# Patient Record
Sex: Male | Born: 1995
Health system: Southern US, Community
[De-identification: ages and names within clinical notes are randomized; demographics above are authoritative.]

## PROBLEM LIST (undated history)

## (undated) DIAGNOSIS — J309 Allergic rhinitis, unspecified: Secondary | ICD-10-CM

## (undated) DIAGNOSIS — K219 Gastro-esophageal reflux disease without esophagitis: Secondary | ICD-10-CM

## (undated) HISTORY — DX: Gastro-esophageal reflux disease without esophagitis: K21.9

## (undated) HISTORY — DX: Allergic rhinitis, unspecified: J30.9

---
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
## 2015-09-12 ENCOUNTER — Encounter: Payer: Self-pay | Admitting: Allergy and Immunology

## 2015-09-12 ENCOUNTER — Ambulatory Visit (INDEPENDENT_AMBULATORY_CARE_PROVIDER_SITE_OTHER): Payer: BLUE CROSS/BLUE SHIELD | Admitting: Allergy and Immunology

## 2015-09-12 VITALS — BP 116/80 | HR 80 | Resp 16 | Ht 66.22 in | Wt 147.3 lb

## 2015-09-12 DIAGNOSIS — J387 Other diseases of larynx: Secondary | ICD-10-CM | POA: Diagnosis not present

## 2015-09-12 DIAGNOSIS — J309 Allergic rhinitis, unspecified: Secondary | ICD-10-CM | POA: Diagnosis not present

## 2015-09-12 DIAGNOSIS — H1010 Acute atopic conjunctivitis, unspecified eye: Secondary | ICD-10-CM

## 2015-09-12 DIAGNOSIS — K219 Gastro-esophageal reflux disease without esophagitis: Secondary | ICD-10-CM

## 2015-09-12 MED ORDER — DEXLANSOPRAZOLE 60 MG PO CPDR: 60.0000 mg | DELAYED_RELEASE_CAPSULE | Freq: Every day | ORAL | Status: DC

## 2015-09-12 NOTE — Patient Instructions (Addendum)
  1. Treat inflammation:   A. continue montelukast 10 mg daily  B. start OTC Rhinocort one spray each nostril daily.  C. prednisone 10 mg one tablet once a day for 10 days only  2. Treat reflux:   A. minimize any chocolate or caffeine consumption  B. Dexilant 60mg  one tablet one time per day  3. If needed:   A. nasal saline washes  B. antihistamines  4. Immunotherapy?  5. Return to clinic in 4 weeks or earlier if problem

## 2015-09-12 NOTE — Progress Notes (Signed)
Follow-up Note  Referring Provider: No ref. provider found Primary Provider: No PCP Per Patient Date of Office Visit: 09/12/2015  Subjective:   Kenneth Tucker (DOB: 11-11-1995) is a 20 y.o. male [facility] in re-evaluation of the following:  HPI: Kenneth Tucker presents to this clinic in reevaluation of his allergic rhinoconjunctivitis and LPR. I've not seen him in his clinic since June 2016. He was attending school at [place] for the past 8 months.  He still complains about having mucus stuck in his throat and some throat clearing. He was participating in a musical this semester and his voice became very raspy and he's lost range. He's had some decreased ability to smell to some degree without any significant nasal congestion. Previously he did have extensive evaluation with a gastroenterologist, ear nose and throat physician, and a limited sinus CT scan which identified very mild chronic sinusitis back in 2014. His evaluation with a gastroenterologist did lead to the diagnosis of gastritis identified on a upper endoscopy. His evaluation with an ear nose and throat physician did not identify any significant abnormality regarding his laryngeal structures.    Medication List           EPIPEN 2-PAK IJ  Inject as directed. USE AS DIRECTED FOR LIFE-THREATENING ALLERGIC REACTIONS.     fexofenadine 180 MG tablet  Commonly known as:  ALLEGRA  Take 180 mg by mouth daily.     GUAIFENESIN PO  Take by mouth as needed.     montelukast 10 MG tablet  Commonly known as:  SINGULAIR  Take 10 mg by mouth daily.        History reviewed. No pertinent past medical history.  History reviewed. No pertinent past surgical history.  No Known Allergies  Review of systems negative except as noted in HPI / PMHx or noted below:  Review of Systems  Constitutional: Negative.   HENT: Negative.   Eyes: Negative.   Respiratory: Negative.     Cardiovascular: Negative.   Gastrointestinal: Negative.   Genitourinary: Negative.   Musculoskeletal: Negative.   Skin: Negative.   Neurological: Negative.   Endo/Heme/Allergies: Negative.   Psychiatric/Behavioral: Negative.      Objective:   Filed Vitals:   09/12/15 1143  BP: 116/80  Pulse: 80  Resp: 16   Height: 5' 6.22" (168.2 cm)  Weight: 147 lb 4.3 oz (66.8 kg)   Physical Exam  Constitutional: He is well-developed, well-nourished, and in no distress.  HENT:  Head: Normocephalic.  Right Ear: Tympanic membrane, external ear and ear canal normal.  Left Ear: Tympanic membrane, external ear and ear canal normal.  Nose: Nose normal. No mucosal edema or rhinorrhea.  Mouth/Throat: Uvula is midline, oropharynx is clear and moist and mucous membranes are normal. No oropharyngeal exudate.  Eyes: Conjunctivae are normal.  Neck: Trachea normal. No tracheal tenderness present. No tracheal deviation present. No thyromegaly present.  Cardiovascular: Normal rate, regular rhythm, S1 normal, S2 normal and normal heart sounds.   No murmur heard. Pulmonary/Chest: Breath sounds normal. No stridor. No respiratory distress. He has no wheezes. He has no rales.  Musculoskeletal: He exhibits no edema.  Lymphadenopathy:       Head (right side): No tonsillar adenopathy present.       Head (left side): No tonsillar adenopathy present.    He has no cervical adenopathy.  Neurological: He is alert. Gait normal.  Skin: No rash noted. He is not diaphoretic. No  erythema. Nails show no clubbing.  Psychiatric: Mood and affect normal.    Diagnostics: None    Assessment and Plan:   1. Allergic rhinoconjunctivitis   2. LPRD (laryngopharyngeal reflux disease)     1. Treat inflammation:   A. continue montelukast 10 mg daily  B. start OTC Rhinocort one spray each nostril daily.  C. prednisone 10 mg one tablet once a day for 10 days only  2. Treat reflux:   A. minimize any chocolate or  caffeine consumption  B. Dexilant 60mg  one tablet one time per day  3. If needed:   A. nasal saline washes  B. antihistamines  4. Immunotherapy?  5. Return to clinic in 4 weeks or earlier if problem  I will treat Kenneth Tucker with anti-inflammatory medications for his respiratory tract and therapy directed against reflux assuming that he does have a component of inflammation affecting both his upper airway and mid airway with a reflux trigger and an atopic trigger. He is in this area for the next 3 months and thus I will see him back in this clinic in 4 weeks to make a determination about how to proceed pending his response. He will be returning to school in [place] come this September.  Laurette SchimkeEric Kozlow, MD [facility]

## 2015-10-06 ENCOUNTER — Encounter: Payer: Self-pay | Admitting: Allergy and Immunology

## 2015-10-06 ENCOUNTER — Ambulatory Visit: Payer: BLUE CROSS/BLUE SHIELD | Admitting: Allergy and Immunology

## 2015-10-06 ENCOUNTER — Ambulatory Visit (INDEPENDENT_AMBULATORY_CARE_PROVIDER_SITE_OTHER): Payer: BLUE CROSS/BLUE SHIELD | Admitting: Allergy and Immunology

## 2015-10-06 VITALS — BP 114/70 | HR 64 | Resp 20

## 2015-10-06 DIAGNOSIS — K219 Gastro-esophageal reflux disease without esophagitis: Secondary | ICD-10-CM

## 2015-10-06 DIAGNOSIS — J387 Other diseases of larynx: Secondary | ICD-10-CM

## 2015-10-06 DIAGNOSIS — J309 Allergic rhinitis, unspecified: Secondary | ICD-10-CM | POA: Diagnosis not present

## 2015-10-06 DIAGNOSIS — H1010 Acute atopic conjunctivitis, unspecified eye: Secondary | ICD-10-CM

## 2015-10-06 NOTE — Progress Notes (Signed)
Follow-up Note  Referring Provider: No ref. provider found Primary Provider: No PCP Per Patient Date of Office Visit: 10/06/2015  Subjective:   Kenneth Tucker (DOB: 02-Apr-1996) is a 20 y.o. male who [facility] 10[facility]/2015 in re-evaluation of the following:  HPI: Kenneth Tucker returns to this clinic in reevaluation of his allergic rhinoconjunctivitis and LPR. Using prednisone he found that he is able to smell better and is not as congested. He thinks his throat is about the same. It might of been a little bit better while using prednisone but has reverted back to his previous condition. He continues to use montelukast consistently and is using Dexilant every day. He did not start his Dexilant until one week ago and for some reason he is not using Rhinocort.    Medication List           acetaminophen 325 MG tablet  Commonly known as:  TYLENOL  Take 650 mg by mouth every 6 (six) hours as needed.     dexlansoprazole 60 MG capsule  Commonly known as:  DEXILANT  Take 1 capsule (60 mg total) by mouth daily.     EPIPEN 2-PAK IJ  Inject as directed. Reported on 10/06/2015     fexofenadine 180 MG tablet  Commonly known as:  ALLEGRA  Take 180 mg by mouth daily.     guaifenesin 400 MG Tabs tablet  Commonly known as:  HUMIBID E  Take 400 mg by mouth daily.     montelukast 10 MG tablet  Commonly known as:  SINGULAIR  Take 10 mg by mouth daily.     MULTIVITAMIN ADULT PO  Take 1 tablet by mouth daily.        History reviewed. No pertinent past medical history.  History reviewed. No pertinent past surgical history.  No Known Allergies  Review of systems negative except as noted in HPI / PMHx or noted below:  Review of Systems  Constitutional: Negative.   HENT: Negative.   Eyes: Negative.   Respiratory: Negative.   Cardiovascular: Negative.   Gastrointestinal: Negative.   Genitourinary: Negative.   Musculoskeletal: Negative.   Skin:  Negative.   Neurological: Negative.   Endo/Heme/Allergies: Negative.   Psychiatric/Behavioral: Negative.      Objective:   Filed Vitals:   10/06/15 1533  BP: 114/70  Pulse: 64  Resp: 20          Physical Exam  Constitutional: He is well-developed, well-nourished, and in no distress.  HENT:  Head: Normocephalic.  Right Ear: Tympanic membrane, external ear and ear canal normal.  Left Ear: Tympanic membrane, external ear and ear canal normal.  Nose: Nose normal. No mucosal edema or rhinorrhea.  Mouth/Throat: Uvula is midline, oropharynx is clear and moist and mucous membranes are normal. No oropharyngeal exudate.  Eyes: Conjunctivae are normal.  Neck: Trachea normal. No tracheal tenderness present. No tracheal deviation present. No thyromegaly present.  Cardiovascular: Normal rate, regular rhythm, S1 normal, S2 normal and normal heart sounds.   No murmur heard. Pulmonary/Chest: Breath sounds normal. No stridor. No respiratory distress. He has no wheezes. He has no rales.  Musculoskeletal: He exhibits no edema.  Lymphadenopathy:       Head (right side): No tonsillar adenopathy present.       Head (left side): No tonsillar adenopathy present.    He has no cervical adenopathy.  Neurological: He is alert. Gait normal.  Skin: No rash noted. He is not diaphoretic.  No erythema. Nails show no clubbing.  Psychiatric: Mood and affect normal.    Diagnostics: None    Assessment and Plan:   1. Allergic rhinoconjunctivitis   2. LPRD (laryngopharyngeal reflux disease)     1. Treat inflammation:   A. continue montelukast 10 mg daily  B. continue OTC Rhinocort one spray each nostril daily.  2. Continue to Treat reflux:   A. minimize any chocolate or caffeine consumption  B. Dexilant  one tablet one time per day  3. If needed:   A. nasal saline washes  B. antihistamines  4. Immunotherapy?  5. Return to clinic in August or earlier if problem  Kenneth Tucker has a  combination of atopic respiratory disease and reflux-induced respiratory disease and hopefully he is going to respond to consistent use of anti-inflammatory medications and therapy against reflux as noted above. I will see him back in this clinic at the end of August to make a determination about how to proceed pending his response. He will be returning to [place] this fall to go back to school and apparently there is not a infirmary at his school that will allow him to receive immunotherapy.  Laurette Schimke, MD [facility]

## 2015-10-06 NOTE — Patient Instructions (Signed)
  1. Treat inflammation:   A. continue montelukast 10 mg daily  B. continue OTC Rhinocort one spray each nostril daily.  2. Continue to Treat reflux:   A. minimize any chocolate or caffeine consumption  B. Dexilant 60mg  one tablet one time per day  3. If needed:   A. nasal saline washes  B. antihistamines  4. Immunotherapy?  5. Return to clinic in August or earlier if problem

## 2015-10-10 ENCOUNTER — Encounter: Payer: Self-pay | Admitting: *Deleted

## 2015-11-16 ENCOUNTER — Other Ambulatory Visit: Payer: Self-pay | Admitting: *Deleted

## 2015-11-16 MED ORDER — DEXLANSOPRAZOLE 60 MG PO CPDR: 60.0000 mg | DELAYED_RELEASE_CAPSULE | Freq: Every day | ORAL | 1 refills | Status: DC

## 2015-11-16 MED ORDER — MONTELUKAST SODIUM 10 MG PO TABS: 10.0000 mg | ORAL_TABLET | Freq: Every day | ORAL | 1 refills | Status: DC

## 2015-11-30 ENCOUNTER — Encounter: Payer: Self-pay | Admitting: Allergy and Immunology

## 2015-11-30 ENCOUNTER — Ambulatory Visit (INDEPENDENT_AMBULATORY_CARE_PROVIDER_SITE_OTHER): Payer: BLUE CROSS/BLUE SHIELD | Admitting: Allergy and Immunology

## 2015-11-30 VITALS — BP 108/72 | HR 64 | Resp 18

## 2015-11-30 DIAGNOSIS — K219 Gastro-esophageal reflux disease without esophagitis: Secondary | ICD-10-CM

## 2015-11-30 DIAGNOSIS — H1010 Acute atopic conjunctivitis, unspecified eye: Secondary | ICD-10-CM

## 2015-11-30 DIAGNOSIS — J309 Allergic rhinitis, unspecified: Secondary | ICD-10-CM

## 2015-11-30 DIAGNOSIS — J387 Other diseases of larynx: Secondary | ICD-10-CM | POA: Diagnosis not present

## 2015-11-30 DIAGNOSIS — J328 Other chronic sinusitis: Secondary | ICD-10-CM | POA: Diagnosis not present

## 2015-11-30 MED ORDER — AMOXICILLIN-POT CLAVULANATE 875-125 MG PO TABS: ORAL_TABLET | ORAL | 0 refills | Status: DC

## 2015-11-30 NOTE — Progress Notes (Signed)
Follow-up Note  Referring Provider: No ref. provider found Primary Provider: No PCP Per Patient Date of Office Visit: 11/30/2015  Subjective:   Kenneth Tucker (DOB: [birth year]) is a 20 y.o. male who [facility] 11[facility]30/2015 in re-evaluation of the following:  HPI: Abas returns to this clinic in reevaluation of his allergic rhinoconjunctivitis, chronic sinusitis, and LPR. With his last visit of 10/06/2015 we asked him to use anti-inflammatory medications for his respiratory tract on a consistent basis and use therapy directed against LPR on a consistent basis. As a result of that treatment he is really not that much better. He still has decreased ability to smell. His throat might be slightly better with less throat clearing but he still has drainage. His airway is not ncongested but he goes through a box of Kleenex every week. He is only using his Rhinocort twice a week.    Medication List      acetaminophen 325 MG tablet Commonly known as:  TYLENOL Take 650 mg by mouth every 6 (six) hours as needed.   dexlansoprazole 60 MG capsule Commonly known as:  DEXILANT Take 1 capsule (60 mg total) by mouth daily.   EPIPEN 2-PAK IJ Inject as directed. Reported on 10/06/2015   fexofenadine 180 MG tablet Commonly known as:  ALLEGRA Take 180 mg by mouth daily.   guaifenesin 400 MG Tabs tablet Commonly known as:  HUMIBID E Take 400 mg by mouth daily.   montelukast 10 MG tablet Commonly known as:  SINGULAIR Take 1 tablet (10 mg total) by mouth daily.   MULTIVITAMIN ADULT PO Take 1 tablet by mouth daily.       Past Medical History:  Diagnosis Date  . Allergic rhinitis   . LPRD (laryngopharyngeal reflux disease)     History reviewed. No pertinent surgical history.  No Known Allergies  Review of systems negative except as noted in HPI / PMHx or noted below:  Review of Systems  Constitutional: Negative.   HENT: Negative.   Eyes:  Negative.   Respiratory: Negative.   Cardiovascular: Negative.   Gastrointestinal: Negative.   Genitourinary: Negative.   Musculoskeletal: Negative.   Skin: Negative.   Neurological: Negative.   Endo/Heme/Allergies: Negative.   Psychiatric/Behavioral: Negative.      Objective:   Vitals:   11/30/15 1343  BP: 108/72  Pulse: 64  Resp: 18          Physical Exam  Constitutional: He is well-developed, well-nourished, and in no distress.  HENT:  Head: Normocephalic.  Right Ear: Tympanic membrane, external ear and ear canal normal.  Left Ear: Tympanic membrane, external ear and ear canal normal.  Nose: Nose normal. No mucosal edema or rhinorrhea.  Mouth/Throat: Uvula is midline, oropharynx is clear and moist and mucous membranes are normal. No oropharyngeal exudate.  Eyes: Conjunctivae are normal.  Neck: Trachea normal. No tracheal tenderness present. No tracheal deviation present. No thyromegaly present.  Cardiovascular: Normal rate, regular rhythm, S1 normal, S2 normal and normal heart sounds.   No murmur heard. Pulmonary/Chest: Breath sounds normal. No stridor. No respiratory distress. He has no wheezes. He has no rales.  Musculoskeletal: He exhibits no edema.  Lymphadenopathy:       Head (right side): No tonsillar adenopathy present.       Head (left side): No tonsillar adenopathy present.    He has no cervical adenopathy.  Neurological: He is alert. Gait normal.  Skin: No rash noted. He is  not diaphoretic. No erythema. Nails show no clubbing.  Psychiatric: Mood and affect normal.    Diagnostics: none  Assessment and Plan:   1. Allergic rhinoconjunctivitis   2. LPRD (laryngopharyngeal reflux disease)   3. Other chronic sinusitis     1. Continue to Treat inflammation:   A. continue montelukast 10 mg daily  B. continue OTC Rhinocort one spray each nostril daily.  2. Continue to Treat reflux:   A. minimize any chocolate or caffeine consumption  B. Discontinue  Dexilant 60mg   3. Treat infection:   A. Augmentin 875 one tablet twice a day for the next 28 days  3. If needed:   A. nasal saline washes  B. antihistamines  4. Immunotherapy?  5. Return to clinic in 6 months or earlier if problem  6. Get fall flu vaccine  I've asked [name] to consistently use a nasal steroid in addition to his leukotriene modifier. As well, we will give him a full 28 days of Augmentin in an attempt to clear out any chronic sinusitis that may still be remaining. I'm going to eliminate his Dexilant and see what happens regarding her throat issue at this point. He should consider a course of immunotherapy and if he can logistically get that arranged when he returns to [place] in 3 weeks he should pursue that form of treatment. I'll see him back in this clinic in 6 months or earlier if there is a problem.  Laurette SchimkeEric Albirta Rhinehart, MD [facility]

## 2015-11-30 NOTE — Patient Instructions (Addendum)
  1. Continue to Treat inflammation:   A. continue montelukast 10 mg daily  B. continue OTC Rhinocort one spray each nostril daily.  2. Continue to Treat reflux:   A. minimize any chocolate or caffeine consumption  B. Discontinue Dexilant   3. Treat infection:   A. Augmentin 875 one tablet twice a day for the next 28 days  3. If needed:   A. nasal saline washes  B. antihistamines  4. Immunotherapy?  5. Return to clinic in 6 months or earlier if problem  6. Get fall flu vaccine

## 2016-03-20 ENCOUNTER — Other Ambulatory Visit: Payer: Self-pay | Admitting: *Deleted

## 2016-03-20 MED ORDER — MONTELUKAST SODIUM 10 MG PO TABS
10.0000 mg | ORAL_TABLET | Freq: Every day | ORAL | 0 refills | Status: DC
Start: 2016-03-20 — End: 2016-10-18

## 2016-04-26 DIAGNOSIS — L700 Acne vulgaris: Secondary | ICD-10-CM | POA: Diagnosis not present

## 2016-09-27 DIAGNOSIS — Z1389 Encounter for screening for other disorder: Secondary | ICD-10-CM | POA: Diagnosis not present

## 2016-09-27 DIAGNOSIS — Z6822 Body mass index (BMI) 22.0-22.9, adult: Secondary | ICD-10-CM | POA: Diagnosis not present

## 2016-09-27 DIAGNOSIS — Z0000 Encounter for general adult medical examination without abnormal findings: Secondary | ICD-10-CM | POA: Diagnosis not present

## 2016-10-15 DIAGNOSIS — F3289 Other specified depressive episodes: Secondary | ICD-10-CM | POA: Diagnosis not present

## 2016-10-17 DIAGNOSIS — L700 Acne vulgaris: Secondary | ICD-10-CM | POA: Diagnosis not present

## 2016-10-18 ENCOUNTER — Other Ambulatory Visit: Payer: Self-pay | Admitting: *Deleted

## 2016-10-18 MED ORDER — MONTELUKAST SODIUM 10 MG PO TABS: 10.0000 mg | ORAL_TABLET | Freq: Every day | ORAL | 0 refills | Status: DC

## 2016-10-25 ENCOUNTER — Ambulatory Visit (INDEPENDENT_AMBULATORY_CARE_PROVIDER_SITE_OTHER): Payer: BLUE CROSS/BLUE SHIELD | Admitting: Pharmacist Clinician (PhC)/ Clinical Pharmacy Specialist

## 2016-10-25 ENCOUNTER — Other Ambulatory Visit (HOSPITAL_COMMUNITY)
Admission: RE | Admit: 2016-10-25 | Discharge: 2016-10-25 | Disposition: A | Discharge status: Home / Self Care | Payer: BLUE CROSS/BLUE SHIELD | Source: Ambulatory Visit | Attending: Infectious Diseases | Admitting: Infectious Diseases

## 2016-10-25 DIAGNOSIS — Z7251 High risk heterosexual behavior: Secondary | ICD-10-CM

## 2016-10-25 LAB — BASIC METABOLIC PANEL
BUN: 9 mg/dL (ref 7–25)
CALCIUM: 9.5 mg/dL (ref 8.6–10.3)
CO2: 26 mmol/L (ref 20–31)
CREATININE: 0.79 mg/dL (ref 0.60–1.35)
Chloride: 103 mmol/L (ref 98–110)
Glucose, Bld: 94 mg/dL (ref 65–99)
Potassium: 4.2 mmol/L (ref 3.5–5.3)
SODIUM: 139 mmol/L (ref 135–146)

## 2016-10-25 NOTE — Patient Instructions (Signed)
Labs today Follow up with me in 1 month

## 2016-10-25 NOTE — Progress Notes (Signed)
HPI: Kenneth Tucker is a 21 y.o. male who is here for his first PrEP visit with pharmacy.   Allergies: No Known Allergies  Vitals:    Past Medical History: Past Medical History:  Diagnosis Date  . Allergic rhinitis   . LPRD (laryngopharyngeal reflux disease)     Social History: Social History   Social History  . Marital status: Single    Spouse name: N/A  . Number of children: N/A  . Years of education: N/A   Social History Main Topics  . Smoking status: Never Smoker  . Smokeless tobacco: Never Used  . Alcohol use No  . Drug use: No  . Sexual activity: Not on file   Other Topics Concern  . Not on file   Social History Narrative  . No narrative on file    Labs: No results found for: HIV1RNAQUANT, HIV1RNAVL, CD4TABS, HEPBSAB, HEPBSAG, HCVAB  CrCl: CrCl cannot be calculated (No order found.).  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 He is home for the summer so he reached out to his primary physician Dr. [name] for PrEP. Dr. [name] referred him here. Therefore, he'll only need PrEP for the summer before he is back in school. He'll reach to a PrEP clinic up there. He is a versatile (including oral) MSM patient with the last sexual encounter about 3 months ago.He consistently use condoms. Explained the whole process for PrEP to him today. He has done some research online about it. He has never been on medication before. Counseled him on the side effects, adherence, and consistent condom use since TRV can only reduce his risk for HIV not STDs. He got an HIV/STDs test about a month ago so we will have to repeat again today.   Recommendations:  All baseline HIV PrEP labs (HIV, bmet, STDs, hep A/B/C) TRV x30d if HIV negative F/u in 30 days   Kenneth SouthwardMinh Tucker, PharmD, BCPS, AAHIVP, CPP Clinical Infectious Disease Pharmacist [facility] 10/25/2016, 2:57 PM

## 2016-10-26 ENCOUNTER — Other Ambulatory Visit: Payer: Self-pay

## 2016-10-26 DIAGNOSIS — F3289 Other specified depressive episodes: Secondary | ICD-10-CM | POA: Diagnosis not present

## 2016-10-26 LAB — CYTOLOGY, (ORAL, ANAL, URETHRAL) ANCILLARY ONLY
Chlamydia: NEGATIVE
Chlamydia: NEGATIVE
Neisseria Gonorrhea: NEGATIVE
Neisseria Gonorrhea: NEGATIVE

## 2016-10-26 LAB — HEPATITIS A ANTIBODY, TOTAL: HEP A TOTAL AB: REACTIVE — AB

## 2016-10-26 LAB — HEPATITIS C ANTIBODY: HCV AB: NEGATIVE

## 2016-10-26 LAB — URINE CYTOLOGY ANCILLARY ONLY
CHLAMYDIA, DNA PROBE: NEGATIVE
NEISSERIA GONORRHEA: NEGATIVE

## 2016-10-26 LAB — HEPATITIS B SURFACE ANTIGEN: HEP B S AG: NEGATIVE

## 2016-10-26 LAB — HEPATITIS B SURFACE ANTIBODY,QUALITATIVE: Hep B S Ab: NEGATIVE

## 2016-10-26 LAB — RPR

## 2016-10-26 LAB — HIV ANTIBODY (ROUTINE TESTING W REFLEX): HIV: NONREACTIVE

## 2016-10-26 MED ORDER — EMTRICITABINE-TENOFOVIR DF 200-300 MG PO TABS: 1.0000 | ORAL_TABLET | Freq: Every day | ORAL | 0 refills | Status: DC

## 2016-10-26 MED FILL — TRUVADA 200-300 MG TABS: 200-300 | 30 days supply | Qty: 30 | Fill #0

## 2016-11-02 DIAGNOSIS — L700 Acne vulgaris: Secondary | ICD-10-CM | POA: Diagnosis not present

## 2016-11-14 DIAGNOSIS — F3289 Other specified depressive episodes: Secondary | ICD-10-CM | POA: Diagnosis not present

## 2016-11-20 DIAGNOSIS — F3289 Other specified depressive episodes: Secondary | ICD-10-CM | POA: Diagnosis not present

## 2016-11-21 ENCOUNTER — Ambulatory Visit (INDEPENDENT_AMBULATORY_CARE_PROVIDER_SITE_OTHER): Payer: BLUE CROSS/BLUE SHIELD | Admitting: Allergy and Immunology

## 2016-11-21 ENCOUNTER — Encounter: Payer: Self-pay | Admitting: Allergy and Immunology

## 2016-11-21 VITALS — BP 118/74 | HR 66 | Resp 14

## 2016-11-21 DIAGNOSIS — J3089 Other allergic rhinitis: Secondary | ICD-10-CM | POA: Diagnosis not present

## 2016-11-21 DIAGNOSIS — J328 Other chronic sinusitis: Secondary | ICD-10-CM

## 2016-11-21 DIAGNOSIS — L700 Acne vulgaris: Secondary | ICD-10-CM | POA: Diagnosis not present

## 2016-11-21 DIAGNOSIS — R490 Dysphonia: Secondary | ICD-10-CM

## 2016-11-21 MED ORDER — MONTELUKAST SODIUM 10 MG PO TABS: 10.0000 mg | ORAL_TABLET | Freq: Every day | ORAL | 1 refills | Status: DC

## 2016-11-21 NOTE — Progress Notes (Signed)
Follow-up Note  Referring Provider: No ref. provider found Primary Provider: Noni Saupeedding, John F. II, MD Date of Office Visit: 11/21/2016  Subjective:   Kenneth Tucker (DOB: [birth year]) is a 21 y.o. male who [facility] 11[facility]21/2016 in re-evaluation of the following:  HPI: Returns to this clinic in reevaluation of his allergic rhinoconjunctivitis and history of chronic sinusitis and LPR. I have not seen him in this clinic since August 2017.  He continues to have issues with postnasal drip and occasional nasal congestion. He has found that his performance voice has been changed recently. He has difficult time singing at this point. He does not have any classic reflux symptoms. He does not use any nasal steroid but does continue on a leukotriene modifier. He did not believe that therapy for reflux in the past has really helped him to any degree.  Allergies as of 11/21/2016   No Known Allergies     Medication List      emtricitabine-tenofovir 200-300 MG tablet Commonly known as:  TRUVADA Take 1 tablet by mouth daily.   EPIPEN 2-PAK IJ Inject as directed. Reported on 10/06/2015   fexofenadine 180 MG tablet Commonly known as:  ALLEGRA Take 180 mg by mouth daily.   guaifenesin 400 MG Tabs tablet Commonly known as:  HUMIBID E Take 400 mg by mouth daily.   montelukast 10 MG tablet Commonly known as:  SINGULAIR Take 1 tablet (10 mg total) by mouth daily.   MYORISAN 40 MG capsule Generic drug:  ISOtretinoin TAKE 1 CAPSULE BY MOUTH ONCE (1) DAILY WITH FOOD       Past Medical History:  Diagnosis Date  . Allergic rhinitis   . LPRD (laryngopharyngeal reflux disease)     History reviewed. No pertinent surgical history.  Review of systems negative except as noted in HPI / PMHx or noted below:  Review of Systems  Constitutional: Negative.   HENT: Negative.   Eyes: Negative.   Respiratory: Negative.   Cardiovascular: Negative.     Gastrointestinal: Negative.   Genitourinary: Negative.   Musculoskeletal: Negative.   Skin: Negative.   Neurological: Negative.   Endo/Heme/Allergies: Negative.   Psychiatric/Behavioral: Negative.      Objective:   Vitals:   11/21/16 1558  BP: 118/74  Pulse: 66  Resp: 14          Physical Exam  Constitutional: He is well-developed, well-nourished, and in no distress.  HENT:  Head: Normocephalic.  Right Ear: Tympanic membrane, external ear and ear canal normal.  Left Ear: Tympanic membrane, external ear and ear canal normal.  Nose: Nose normal. No mucosal edema or rhinorrhea.  Mouth/Throat: Uvula is midline, oropharynx is clear and moist and mucous membranes are normal. No oropharyngeal exudate.  Eyes: Conjunctivae are normal.  Neck: Trachea normal. No tracheal tenderness present. No tracheal deviation present. No thyromegaly present.  Cardiovascular: Normal rate, regular rhythm, S1 normal, S2 normal and normal heart sounds.   No murmur heard. Pulmonary/Chest: Breath sounds normal. No stridor. No respiratory distress. He has no wheezes. He has no rales.  Musculoskeletal: He exhibits no edema.  Lymphadenopathy:       Head (right side): No tonsillar adenopathy present.       Head (left side): No tonsillar adenopathy present.    He has no cervical adenopathy.  Neurological: He is alert. Gait normal.  Skin: No rash noted. He is not diaphoretic. No erythema. Nails show no clubbing.  Psychiatric: Mood and  affect normal.    Diagnostics: none  Assessment and Plan:   1. Other allergic rhinitis   2. Other chronic sinusitis   3. Hoarseness of voice     1. Continue to Treat inflammation:   A. continue montelukast 10 mg daily  B. restart OTC Rhinocort one spray each nostril daily.  2. If needed:   A. nasal saline washes  B. Antihistamines - Allegra  C. OTC Mucinex   3. [facility] within the next month  4. Return to clinic in 6  months or earlier if problem  5. Get fall flu vaccine  [name] has inflammation of his upper airway and probably has a component of mild chronic sinusitis and I have encouraged him to consistently use a nasal steroid in addition to his leukotriene modifier to help with the inflammation of his upper airway. His voice has changed recently and I would like for him to be evaluated at the voice disorder Center while he is home on break from school to investigate the cause of his altered performance voice. I will see him back in this clinic in 6 months or earlier if there is a problem.  Laurette SchimkeEric Winifred Balogh, MD Allergy / Immunology [facility]

## 2016-11-21 NOTE — Patient Instructions (Signed)
  1. Continue to Treat inflammation:   A. continue montelukast 10 mg daily  B. restart OTC Rhinocort one spray each nostril daily.  2. If needed:   A. nasal saline washes  B. Antihistamines - Allegra  C. OTC Mucinex   3. [facility]ointment with [facility] within the next month  4. Return to clinic in 6 months or earlier if problem  5. Get fall flu vaccine

## 2016-11-22 ENCOUNTER — Telehealth: Payer: Self-pay

## 2016-11-22 NOTE — Telephone Encounter (Signed)
Patient referral set up to [facility]add[facility] @ 1:20 01/02/2017. Patient will then see Speech Pathologist @ 2:30   [phone] [facility] [address] [place] [phone]  They will send the patient a new patient packet. I will call and inform the parents.  Thanks

## 2016-11-28 ENCOUNTER — Ambulatory Visit (INDEPENDENT_AMBULATORY_CARE_PROVIDER_SITE_OTHER): Payer: BLUE CROSS/BLUE SHIELD | Admitting: Pharmacist Clinician (PhC)/ Clinical Pharmacy Specialist

## 2016-11-28 DIAGNOSIS — Z7251 High risk heterosexual behavior: Secondary | ICD-10-CM | POA: Diagnosis not present

## 2016-11-28 NOTE — Patient Instructions (Signed)
We will send in 3 months supply if negative

## 2016-11-28 NOTE — Progress Notes (Signed)
HPI: Kenneth Tucker is a 21 y.o. male who is here for his 1 mo PrEP visit with pharmacy.   Allergies: No Known Allergies  Vitals:    Past Medical History: Past Medical History:  Diagnosis Date  . Allergic rhinitis   . LPRD (laryngopharyngeal reflux disease)     Social History: Social History   Social History  . Marital status: Single    Spouse name: N/A  . Number of children: N/A  . Years of education: N/A   Social History Main Topics  . Smoking status: Never Smoker  . Smokeless tobacco: Never Used  . Alcohol use No  . Drug use: No  . Sexual activity: Not on file   Other Topics Concern  . Not on file   Social History Narrative  . No narrative on file    Current Regimen: TRV  Labs: Hep B S Ab (no units)  Date Value  10/25/2016 NEG   Hepatitis B Surface Ag (no units)  Date Value  10/25/2016 NEGATIVE   HCV Ab (no units)  Date Value  10/25/2016 NEGATIVE    CrCl: CrCl cannot be calculated (Patient's most recent lab result is older than the maximum 21 days allowed.).  Lipids: No results found for: CHOL, TRIG, HDL, CHOLHDL, VLDL, LDLCALC  Assessment: Kenneth Tucker started on PrEP about a month ago after returning here for the summer break for college. He will be going back in about 4 wks. He said if we know of any Pr[place] clinics up there in [place]. Told him that I'll try to search for some up there and will let him know. He will not follow up with us anymore when he is back in school. He has had only 2 sexual encounters in the past 6 months or so. He is a versatile partner that also do oral. His condom use is consistent. Encourage him to continue to be good with his condom use to reduce his risk of HIV and STDs. He has not missed any doses so far and has very little to no side effects.   Recommendations:  HIV ab and bmet today Truvada x 58mo if neg  Ulyses SouthwardMinh Tyler Robidoux, PharmD, BCPS, AAHIVP, CPP Clinical Infectious Disease Pharmacist [facility] 11/28/2016, 9:30 AM

## 2016-11-29 ENCOUNTER — Telehealth: Payer: Self-pay | Admitting: Pharmacist Clinician (PhC)/ Clinical Pharmacy Specialist

## 2016-11-29 ENCOUNTER — Other Ambulatory Visit: Payer: Self-pay | Admitting: Pharmacist Clinician (PhC)/ Clinical Pharmacy Specialist

## 2016-11-29 LAB — BASIC METABOLIC PANEL
BUN: 12 mg/dL (ref 7–25)
CALCIUM: 9.8 mg/dL (ref 8.6–10.3)
CO2: 21 mmol/L (ref 20–32)
Chloride: 106 mmol/L (ref 98–110)
Creat: 0.78 mg/dL (ref 0.60–1.35)
GLUCOSE: 83 mg/dL (ref 65–99)
Potassium: 3.9 mmol/L (ref 3.5–5.3)
Sodium: 141 mmol/L (ref 135–146)

## 2016-11-29 LAB — HIV ANTIBODY (ROUTINE TESTING W REFLEX): HIV: NONREACTIVE

## 2016-11-29 MED ORDER — EMTRICITABINE-TENOFOVIR DF 200-300 MG PO TABS: 1.0000 | ORAL_TABLET | Freq: Every day | ORAL | 2 refills | Status: AC

## 2016-11-29 MED FILL — TRUVADA 200-300 MG TABS: 200-300 | 30 days supply | Qty: 30 | Fill #0

## 2016-11-29 NOTE — Telephone Encounter (Signed)
HIV ab neg. Left a VM to call back about PrEP.

## 2016-12-12 DIAGNOSIS — F3289 Other specified depressive episodes: Secondary | ICD-10-CM | POA: Diagnosis not present

## 2016-12-19 DIAGNOSIS — F3289 Other specified depressive episodes: Secondary | ICD-10-CM | POA: Diagnosis not present

## 2016-12-21 DIAGNOSIS — L700 Acne vulgaris: Secondary | ICD-10-CM | POA: Diagnosis not present

## 2016-12-27 MED FILL — TRUVADA 200-300 MG TABS: 200-300 | 30 days supply | Qty: 30 | Fill #1

## 2017-01-24 ENCOUNTER — Telehealth: Payer: Self-pay | Admitting: Pharmacy Technician

## 2017-01-24 NOTE — Telephone Encounter (Signed)
Message from [facility]:    Kenneth Tucker 12/20/1995 moved to [facility]. We are not allowed to mail any meds to any state but [place], [place], [place], [place], [place], and [place]. We called and talked this over w him yesterday but we also said during the course of the call that we could call [facility] to clear it and we cannot do it.   I called him again and attempted to LM but his VM is full  We will have to wait for his return call to let him know that he will need to get a pharmacy in [place] to fill his medications.

## 2017-03-13 DIAGNOSIS — L700 Acne vulgaris: Secondary | ICD-10-CM | POA: Diagnosis not present

## 2017-04-18 DIAGNOSIS — F329 Major depressive disorder, single episode, unspecified: Secondary | ICD-10-CM | POA: Diagnosis not present

## 2017-04-18 DIAGNOSIS — Z6822 Body mass index (BMI) 22.0-22.9, adult: Secondary | ICD-10-CM | POA: Diagnosis not present

## 2017-04-22 DIAGNOSIS — L700 Acne vulgaris: Secondary | ICD-10-CM | POA: Diagnosis not present

## 2017-04-24 ENCOUNTER — Ambulatory Visit: Payer: BLUE CROSS/BLUE SHIELD | Admitting: Allergy and Immunology

## 2017-04-24 DIAGNOSIS — R1310 Dysphagia, unspecified: Secondary | ICD-10-CM | POA: Diagnosis not present

## 2017-04-24 DIAGNOSIS — J383 Other diseases of vocal cords: Secondary | ICD-10-CM | POA: Diagnosis not present

## 2017-04-24 DIAGNOSIS — J385 Laryngeal spasm: Secondary | ICD-10-CM | POA: Diagnosis not present

## 2017-04-24 DIAGNOSIS — R490 Dysphonia: Secondary | ICD-10-CM | POA: Diagnosis not present

## 2017-05-09 DIAGNOSIS — R490 Dysphonia: Secondary | ICD-10-CM | POA: Diagnosis not present

## 2017-05-09 DIAGNOSIS — J385 Laryngeal spasm: Secondary | ICD-10-CM | POA: Diagnosis not present

## 2017-05-09 DIAGNOSIS — J383 Other diseases of vocal cords: Secondary | ICD-10-CM | POA: Diagnosis not present

## 2017-08-14 ENCOUNTER — Other Ambulatory Visit: Payer: Self-pay

## 2017-08-14 MED ORDER — MONTELUKAST SODIUM 10 MG PO TABS: 10.0000 mg | ORAL_TABLET | Freq: Every day | ORAL | 0 refills | Status: AC

## 2017-09-12 DIAGNOSIS — Z6823 Body mass index (BMI) 23.0-23.9, adult: Secondary | ICD-10-CM | POA: Diagnosis not present

## 2017-09-12 DIAGNOSIS — F329 Major depressive disorder, single episode, unspecified: Secondary | ICD-10-CM | POA: Diagnosis not present

## 2017-09-12 DIAGNOSIS — F419 Anxiety disorder, unspecified: Secondary | ICD-10-CM | POA: Diagnosis not present

## 2017-09-23 DIAGNOSIS — M25562 Pain in left knee: Secondary | ICD-10-CM | POA: Diagnosis not present

## 2017-09-23 DIAGNOSIS — L739 Follicular disorder, unspecified: Secondary | ICD-10-CM | POA: Diagnosis not present

## 2017-09-23 DIAGNOSIS — Z6823 Body mass index (BMI) 23.0-23.9, adult: Secondary | ICD-10-CM | POA: Diagnosis not present

## 2017-09-23 DIAGNOSIS — M25561 Pain in right knee: Secondary | ICD-10-CM | POA: Diagnosis not present

## 2018-05-02 DIAGNOSIS — L309 Dermatitis, unspecified: Secondary | ICD-10-CM | POA: Diagnosis not present

## 2018-05-02 DIAGNOSIS — Z1331 Encounter for screening for depression: Secondary | ICD-10-CM | POA: Diagnosis not present

## 2018-05-02 DIAGNOSIS — F329 Major depressive disorder, single episode, unspecified: Secondary | ICD-10-CM | POA: Diagnosis not present

## 2018-05-02 DIAGNOSIS — F419 Anxiety disorder, unspecified: Secondary | ICD-10-CM | POA: Diagnosis not present

## 2018-07-25 DIAGNOSIS — Z23 Encounter for immunization: Secondary | ICD-10-CM | POA: Diagnosis not present

## 2018-10-03 DIAGNOSIS — S61411A Laceration without foreign body of right hand, initial encounter: Secondary | ICD-10-CM | POA: Diagnosis not present
